# Patient Record
Sex: Male | Born: 1935 | Race: White | Hispanic: No | Marital: Married
Health system: Southern US, Community
[De-identification: ages and names within clinical notes are randomized; demographics above are authoritative.]

---

## 2011-03-31 LAB — COMPREHENSIVE METABOLIC PANEL
Albumin: 3.3 g/dL — ABNORMAL LOW (ref 3.4–5.0)
Alkaline Phosphatase: 77 U/L (ref 50–136)
BUN: 20 mg/dL — ABNORMAL HIGH (ref 7–18)
Calcium, Total: 8.9 mg/dL (ref 8.5–10.1)
Glucose: 143 mg/dL — ABNORMAL HIGH (ref 65–99)
SGOT(AST): 30 U/L (ref 15–37)
Sodium: 139 mmol/L (ref 136–145)

## 2011-03-31 LAB — CBC
HCT: 38.8 % — ABNORMAL LOW (ref 40.0–52.0)
HGB: 13.2 g/dL (ref 13.0–18.0)
MCH: 31.2 pg (ref 26.0–34.0)
MCHC: 34.1 g/dL (ref 32.0–36.0)
MCV: 91 fL (ref 80–100)
RBC: 4.25 10*6/uL — ABNORMAL LOW (ref 4.40–5.90)

## 2011-04-01 ENCOUNTER — Inpatient Hospital Stay: Payer: Self-pay | Admitting: Student

## 2011-04-01 LAB — URINALYSIS, COMPLETE
Bacteria: NONE SEEN
Bilirubin,UR: NEGATIVE
Glucose,UR: 150 mg/dL (ref 0–75)
Leukocyte Esterase: NEGATIVE
Nitrite: NEGATIVE
Ph: 6 (ref 4.5–8.0)
Protein: NEGATIVE
Specific Gravity: 1.023 (ref 1.003–1.030)

## 2011-04-01 LAB — TROPONIN I
Troponin-I: 0.02 ng/mL
Troponin-I: 0.03 ng/mL

## 2011-04-01 LAB — CK TOTAL AND CKMB (NOT AT ARMC)
CK, Total: 167 U/L (ref 35–232)
CK, Total: 249 U/L — ABNORMAL HIGH
CK-MB: 1.3 ng/mL (ref 0.5–3.6)
CK-MB: 1.9 ng/mL

## 2011-04-02 LAB — TROPONIN I: Troponin-I: <0.02 ng/mL

## 2011-04-02 LAB — CK TOTAL AND CKMB (NOT AT ARMC)
CK, Total: 285 U/L — ABNORMAL HIGH (ref 35–232)
CK-MB: 2.4 ng/mL (ref 0.5–3.6)

## 2011-04-03 LAB — CBC WITH DIFFERENTIAL/PLATELET
Basophil #: 0 10*3/uL (ref 0.0–0.1)
Basophil %: 0.3 %
HCT: 36.8 % — ABNORMAL LOW (ref 40.0–52.0)
HGB: 12.5 g/dL — ABNORMAL LOW (ref 13.0–18.0)
Lymphocyte #: 0.5 10*3/uL — ABNORMAL LOW (ref 1.0–3.6)
Lymphocyte %: 9 %
MCHC: 34 g/dL (ref 32.0–36.0)
MCV: 91 fL (ref 80–100)
Monocyte #: 0.3 10*3/uL (ref 0.0–0.7)
Monocyte %: 5.3 %
Neutrophil #: 4.6 10*3/uL (ref 1.4–6.5)
Neutrophil %: 85.2 %
RDW: 13.6 % (ref 11.5–14.5)
WBC: 5.4 10*3/uL (ref 3.8–10.6)

## 2011-04-03 LAB — BASIC METABOLIC PANEL
BUN: 16 mg/dL (ref 7–18)
Calcium, Total: 9 mg/dL (ref 8.5–10.1)
EGFR (African American): >60
EGFR (Non-African Amer.): >60
Glucose: 100 mg/dL — ABNORMAL HIGH (ref 65–99)
Osmolality: 279 (ref 275–301)
Sodium: 139 mmol/L (ref 136–145)

## 2011-04-05 ENCOUNTER — Encounter: Payer: Self-pay | Admitting: Internal Medicine

## 2011-04-06 LAB — CULTURE, BLOOD (SINGLE)

## 2011-04-14 LAB — TSH: Thyroid Stimulating Horm: 1.43 u[IU]/mL

## 2011-04-29 ENCOUNTER — Encounter: Payer: Self-pay | Admitting: Internal Medicine

## 2011-05-06 ENCOUNTER — Ambulatory Visit: Payer: Self-pay | Admitting: Internal Medicine

## 2013-01-17 ENCOUNTER — Ambulatory Visit: Payer: Self-pay | Admitting: Internal Medicine

## 2013-01-17 ENCOUNTER — Inpatient Hospital Stay: Payer: Self-pay | Admitting: Student

## 2013-01-17 LAB — COMPREHENSIVE METABOLIC PANEL
Albumin: 3.3 g/dL — ABNORMAL LOW (ref 3.4–5.0)
Anion Gap: 4 — ABNORMAL LOW (ref 7–16)
BUN: 15 mg/dL (ref 7–18)
Bilirubin,Total: 0.8 mg/dL (ref 0.2–1.0)
Co2: 29 mmol/L (ref 21–32)
Creatinine: 1.2 mg/dL (ref 0.60–1.30)
EGFR (African American): >60
EGFR (Non-African Amer.): 53 — ABNORMAL LOW
Glucose: 128 mg/dL — ABNORMAL HIGH (ref 65–99)

## 2013-01-17 LAB — CBC
HCT: 39.4 % — ABNORMAL LOW (ref 40.0–52.0)
MCH: 31.2 pg (ref 26.0–34.0)
MCHC: 35 g/dL (ref 32.0–36.0)
MCV: 89 fL (ref 80–100)
Platelet: 206 10*3/uL (ref 150–440)
RBC: 4.42 10*6/uL (ref 4.40–5.90)
RDW: 13.2 % (ref 11.5–14.5)
WBC: 9.6 10*3/uL (ref 3.8–10.6)

## 2013-01-17 LAB — HEMOGLOBIN A1C: Hemoglobin A1C: 5.7 % (ref 4.2–6.3)

## 2013-01-17 LAB — URINALYSIS, COMPLETE
Glucose,UR: 50 mg/dL (ref 0–75)
Ketone: NEGATIVE
Nitrite: NEGATIVE
Ph: 6 (ref 4.5–8.0)
Protein: NEGATIVE
RBC,UR: 20 /HPF (ref 0–5)
Specific Gravity: 1.021 (ref 1.003–1.030)
Squamous Epithelial: NONE SEEN
WBC UR: 2 /HPF (ref 0–5)

## 2013-01-17 LAB — CK TOTAL AND CKMB (NOT AT ARMC): CK, Total: 57 U/L (ref 35–232)

## 2013-01-17 LAB — TROPONIN I: Troponin-I: <0.02 ng/mL

## 2013-01-18 LAB — CBC WITH DIFFERENTIAL/PLATELET
Basophil #: 0 10*3/uL (ref 0.0–0.1)
Eosinophil %: 0 %
HCT: 36.1 % — ABNORMAL LOW (ref 40.0–52.0)
HGB: 12.8 g/dL — ABNORMAL LOW (ref 13.0–18.0)
Lymphocyte #: 0.4 10*3/uL — ABNORMAL LOW (ref 1.0–3.6)
MCH: 31.3 pg (ref 26.0–34.0)
MCV: 88 fL (ref 80–100)
Monocyte %: 1.8 %
Neutrophil #: 12.4 10*3/uL — ABNORMAL HIGH (ref 1.4–6.5)
Neutrophil %: 95 %
WBC: 13 10*3/uL — ABNORMAL HIGH (ref 3.8–10.6)

## 2013-01-18 LAB — COMPREHENSIVE METABOLIC PANEL
Albumin: 2.6 g/dL — ABNORMAL LOW (ref 3.4–5.0)
Alkaline Phosphatase: 81 U/L (ref 50–136)
Chloride: 109 mmol/L — ABNORMAL HIGH (ref 98–107)
Co2: 24 mmol/L (ref 21–32)
EGFR (Non-African Amer.): 58 — ABNORMAL LOW
Osmolality: 286 (ref 275–301)
SGPT (ALT): 19 U/L (ref 12–78)
Total Protein: 6.9 g/dL (ref 6.4–8.2)

## 2013-01-21 LAB — CBC WITH DIFFERENTIAL/PLATELET
Basophil #: 0 10*3/uL (ref 0.0–0.1)
Basophil %: 0.2 %
Eosinophil %: 0 %
HCT: 38.6 % — ABNORMAL LOW (ref 40.0–52.0)
HGB: 13.5 g/dL (ref 13.0–18.0)
Lymphocyte %: 4.5 %
MCH: 31.2 pg (ref 26.0–34.0)
MCHC: 34.9 g/dL (ref 32.0–36.0)
MCV: 89 fL (ref 80–100)
Monocyte #: 0.5 x10 3/mm (ref 0.2–1.0)
Monocyte %: 4.2 %
Neutrophil %: 91.1 %
RDW: 13.1 % (ref 11.5–14.5)

## 2013-01-26 ENCOUNTER — Ambulatory Visit: Payer: Self-pay | Admitting: Internal Medicine

## 2013-02-05 ENCOUNTER — Emergency Department: Payer: Self-pay | Admitting: Emergency Medicine

## 2013-02-05 LAB — COMPREHENSIVE METABOLIC PANEL
BUN: 31 mg/dL — ABNORMAL HIGH (ref 7–18)
Osmolality: 293 (ref 275–301)
Potassium: 4.3 mmol/L (ref 3.5–5.1)
SGOT(AST): 26 U/L (ref 15–37)
Sodium: 142 mmol/L (ref 136–145)
Total Protein: 6.6 g/dL (ref 6.4–8.2)

## 2013-02-05 LAB — URINALYSIS, COMPLETE
Bilirubin,UR: NEGATIVE
Glucose,UR: 50 mg/dL (ref 0–75)
Ketone: NEGATIVE
RBC,UR: 1072 /HPF (ref 0–5)

## 2013-02-05 LAB — CBC
HGB: 14.4 g/dL (ref 13.0–18.0)
MCV: 91 fL (ref 80–100)
WBC: 10.7 10*3/uL — ABNORMAL HIGH (ref 3.8–10.6)

## 2013-02-08 LAB — URINE CULTURE

## 2013-02-10 LAB — CULTURE, BLOOD (SINGLE)

## 2013-02-12 ENCOUNTER — Inpatient Hospital Stay: Payer: Self-pay | Admitting: Internal Medicine

## 2013-02-12 LAB — URINALYSIS, COMPLETE
Bacteria: NONE SEEN
Bilirubin,UR: NEGATIVE
Nitrite: NEGATIVE
Ph: 5 (ref 4.5–8.0)
RBC,UR: 737 /HPF (ref 0–5)
Specific Gravity: 1.029 (ref 1.003–1.030)

## 2013-02-12 LAB — CK TOTAL AND CKMB (NOT AT ARMC): CK, Total: 49 U/L (ref 35–232)

## 2013-02-12 LAB — COMPREHENSIVE METABOLIC PANEL
Alkaline Phosphatase: 139 U/L — ABNORMAL HIGH (ref 50–136)
Calcium, Total: 9.8 mg/dL (ref 8.5–10.1)
EGFR (African American): 55 — ABNORMAL LOW
Potassium: 4 mmol/L (ref 3.5–5.1)
SGOT(AST): 29 U/L (ref 15–37)
SGPT (ALT): 44 U/L (ref 12–78)
Total Protein: 7.4 g/dL (ref 6.4–8.2)

## 2013-02-12 LAB — CBC
HCT: 46.4 % (ref 40.0–52.0)
HGB: 15.3 g/dL (ref 13.0–18.0)
MCH: 30.3 pg (ref 26.0–34.0)
RBC: 5.05 10*6/uL (ref 4.40–5.90)
WBC: 5.2 10*3/uL (ref 3.8–10.6)

## 2013-02-12 LAB — TROPONIN I: Troponin-I: 0.02 ng/mL

## 2013-02-13 LAB — CBC WITH DIFFERENTIAL/PLATELET
Basophil %: 0.1 %
Eosinophil #: 0 10*3/uL (ref 0.0–0.7)
Eosinophil %: 0 %
HGB: 11.9 g/dL — ABNORMAL LOW (ref 13.0–18.0)
Lymphocyte %: 3.8 %
MCH: 30.3 pg (ref 26.0–34.0)
MCHC: 33.2 g/dL (ref 32.0–36.0)
MCV: 91 fL (ref 80–100)
Monocyte #: 0.3 x10 3/mm (ref 0.2–1.0)
Monocyte %: 4.9 %
Platelet: 162 10*3/uL (ref 150–440)
RBC: 3.93 10*6/uL — ABNORMAL LOW (ref 4.40–5.90)

## 2013-02-13 LAB — BASIC METABOLIC PANEL
Anion Gap: 8 (ref 7–16)
BUN: 23 mg/dL — ABNORMAL HIGH (ref 7–18)
Calcium, Total: 8.6 mg/dL (ref 8.5–10.1)
Co2: 24 mmol/L (ref 21–32)
Creatinine: 1.02 mg/dL (ref 0.60–1.30)
EGFR (African American): >60
Glucose: 304 mg/dL — ABNORMAL HIGH (ref 65–99)
Osmolality: 321 (ref 275–301)
Potassium: 3.8 mmol/L (ref 3.5–5.1)

## 2013-02-13 LAB — TROPONIN I: Troponin-I: 0.02 ng/mL

## 2013-02-16 LAB — URINE CULTURE

## 2013-02-17 LAB — CULTURE, BLOOD (SINGLE)

## 2013-02-25 ENCOUNTER — Ambulatory Visit: Payer: Self-pay | Admitting: Internal Medicine

## 2013-02-25 DEATH — deceased

## 2014-07-18 NOTE — H&P (Signed)
PATIENT NAME:  Julian SleeperMURDOCK, Kurtis M MR#:  [id number] DATE OF BIRTH:  11-08-1921  DATE OF ADMISSION:  01/17/2013  PRIMARY CARE PHYSICIAN: Dr. Cay SchillingsJack Wolf.  The patient is an 81 year old Caucasian male with past medical history significant for history of TIAs, history of dysphagia, as well as history of mental decline, dementia, who presents to the hospital with complaints of chest congestion. Apparently, the patient was doing well up until a few days ago when he started having problems with chest congestion. He denies any episodes of dysphagia or choking on food, but he admits of rattling in the chest as well as producing some clear thin liquid. Because family knew his history of aspiration, they suspected that he may have had pneumonia, so they decided to bring him to the Emergency Room for further evaluation. He was weaker and more lethargic than usual.  Patient lives in [place] with his wife independently. His niece, who lives close by, helps him with groceries, but otherwise they are able to cook at home and able to function quite well. Apparently, since the years he was noted to have some mental decline and  speech decline. The patient denies any chest pains. Admits to having some cough.   PAST MEDICAL HISTORY: Significant for history of admission in January 2013 for bronchitis, deconditioning, gait dysfunction, dementia as well as dysphagia. Also, history of coronary artery disease, status post MI. History of stent in [facility] in 1996. History of TIAs. BPH as based on medication list.   PAST SURGICAL HISTORY: Stents x 3 in 1996.   MEDICATIONS: The patient's medication list is as follows: Aspirin 81 mg daily, donepezil 5 mg p.o. at bedtime and Flomax 0.4 mg daily. Based on his medication list, I also suspect BPH.   ALLERGIES: None.   FAMILY HISTORY: The patient's mother as well father both died of coronary artery disease and myocardial infarction in their 280s.   SOCIAL HISTORY: The  patient is married. He has no children. Lives with his wife independently. Niece is helping them with groceries although they are able to function well in the house alone and they cook for themselves. No alcohol, illicit drugs. No smoking. He used to work as a Retail buyercomputer operator [place]  REVIEW OF SYSTEMS: Difficult to obtain since the patient had BiPAP on his nose. He denies any fevers, chills, fatigue, weakness. Denies any pains or weight loss or gain.  EYES: Denies any blurry vision, double vision, glaucoma or cataracts.  ENT: Denies any tinnitus, allergies, epistaxis, sinus pain, dentures, difficulty swallowing.  RESPIRATORY: Admits to cough. Denies any wheezes, hemoptysis. Admits to shortness of breath, as well as light colored, thin sputum.  CARDIOVASCULAR: Denies any chest pain, orthopnea, edema, arrhythmias, palpitations or syncope. GASTROINTESTINAL: Denies any nausea, vomiting, (Dictation Anomaly)diarrhea, abdominal pains  . GENITOURINARY: Denies urinary (Dictation Anomaly)frequency or  incontinence.  ENDOCRINE: (Dictation Anomaly)Denies   thyroid problems, heat or cold intolerance or thirst.  HEMATOLOGIC: Denies anemia, easy bruisability (Dictation Anomaly) or bleeding.  SKIN: Denies any acne, rashes, lesions or change in moles.  MUSCULOSKELETAL: Denies arthritis, cramps, swelling. No numbness, (Dictation Anomaly)weakness  or tremor.  PSYCHIATRIC: Denies anxiety, insomnia or depression.   PHYSICAL EXAMINATION: VITAL SIGNS: On arrival to the hospital, the patient's vital signs temperature is 98.9, pulse is 71, respirations 18, blood pressure 146/67, saturation was 94% on oxygen therapy.  GENERAL: This is a well-developed, well-nourished, Caucasian male in no significant distress lying on the stretcher.  HEENT: Pupils are equal, reactive  to light. Extraocular movements are intact, no icterus or conjunctivitis. Has normal hearing. Mucosa is moist.  NECK: No masses. Supple,  nontender. Thyroid is not enlarged. No adenopathy. No JVD or carotid bruits bilaterally. Full range of motion.  LUNGS: Rales as well as rhonchi were heard bilaterally in the upper lobe, somewhat diminished breath sounds were noted in the right upper lobe and no significant wheezing. The patient does have labored inspirations as well as increased effort to breathe. He is in mild respiratory distress. He is using BiPAP to breathe. His oxygen saturation are 96% on BiPAP.  CARDIOVASCULAR: S1, S2 appreciated (Dictation Anomaly), rythm was regular .  CHEST: Is nontender to palpation.  EXTREMITIES: 1+ pedal pulses. No lower extremity edema, calf tenderness or cyanosis was noted.  ABDOMEN: Soft, nontender. Bowel sounds are present. No hepatosplenomegaly or masses were noted.  RECTAL: Deferred.  MUSCLE STRENGTH: Able to move all extremities. No cyanosis, degenerative joint disease. Mild kyphosis. Gait is not tested.  SKIN: Did not show any rashes, lesions, erythema, nodularity or induration. It was warm and dry to palpation.  LYMPHATIC: No adenopathy in the cervical region.  NEUROLOGIC: Cranial nerves grossly intact. Sensory is intact. No dysarthria aphasia. The patient is alert and oriented to person, place. Cooperative.  Memory (Dictation Anomaly)is good  good.  However, I was not able to explore much. Otherwise, no depression or agitation noted.   EKG: Sinus rhythm with premature atrial complexes at 73 beats per minute, right bundle branch block, and no acute ST-T changes were noted. Compared to prior EKG done in January 2013, no changes were noted.   The patient's lab data done in the Emergency Room showed a glucose 128. Otherwise, BMP was unremarkable. Albumin level was low at 3.3. Liver enzymes were normal. Cardiac enzymes, first set negative. White blood cell count was normal at 9.6, hemoglobin was 13.8 and platelet count was 206. Chest x-ray showed right upper lobe infiltrate   ASSESSMENT AND  PLAN: 1. Acute respiratory failure due to a questionable  aspiration pneumonia.  Admit the patient to the medical floor and telemetry. The patient initially told me that he would not want  to have intubation; however, the patient's family, his wife questioned this assumption, so we will be getting palliative care involved to explain what it is with intubation and in general discuss code. 2. Right upper lobe pneumonia. Suspected aspiration. We will get speech therapist involved. We will continue the patient on Zosyn. We will get sputum cultures. We will also cover atypicals with Zithromax.  3. We will add also steroids, inhalers and will continue oxygen therapy via BiPAP for now. Wean off BiPAP if possible depending on his improvement.   4. Dysphagia. We will ask speech to evaluate him.  5. Dementia. We will continue home medications.   TIME SPENT: 50 minutes on the patient.   ____________________________ Katharina Caper, MD rv:sg D: 01/17/2013 13:11:40 ET T: 01/17/2013 13:48:57 ET JOB#: [id number]  cc: Katharina Caper, MD, <Dictator> Mickie Hillier. Sheppard Penton, MD   Katharina Caper MD ELECTRONICALLY SIGNED 02/23/2013 20:41

## 2014-07-18 NOTE — Discharge Summary (Signed)
PATIENT NAME:  Julian Lewis, CAPERTON MR#:  [id number] DATE OF BIRTH:  1921-04-02  DATE OF ADMISSION:  02/12/2013 DATE OF DISCHARGE:  02/13/2013  ADMITTING PHYSICIAN:  Alford Highland, MD  DISCHARGING PHYSICIAN: Enid Baas, MD  PRIMARY CARE PHYSICIAN: Mickie Hillier. Sheppard Penton, MD  CONSULTATIONS IN THE HOSPITAL: Palliative care consultation by Ned Grace, MD  DISCHARGE DIAGNOSES: 1.  Acute respiratory failure.  2.  Aspiration pneumonia.  3.  History of cerebrovascular accident with dysphagia.  4.  Dementia.  5.  Esophageal dysmotility disorder seen on modified barium swallow study from October 2014.  5.  Coronary artery disease, status post stents.  6.  Sepsis.  7.  Atrial fibrillation with rapid ventricular response.  8.  Acute metabolic encephalopathy.  9.  Acute renal failure.  10.  Hypernatremia.   DISCHARGE MEDICATIONS: 1.  Tylenol 650 mg p.o. q.4 hours p.r.n. for pain or fever.  2.  Scopolamine patch every 3 days, 1 patch.  3.  Roxanol 20 mg per her mL oral concentrate 0.25 mL to 0.5 mL q.1 to 2 hours p.r.n. for pain or dyspnea.  4.  Ativan 0.5 mg 1 to 2 tablets orally q.2 to 4 hours p.r.n. for agitation or anxiety.   LABS AND IMAGING STUDIES PRIOR TO DISCHARGE:  1.  WBC 7.0, hemoglobin 11.9, hematocrit 36.0, platelet count 162.  2.  Sodium 154, potassium 3.8, chloride 122, bicarb 24, BUN 23, creatinine 1.02, glucose 304 and calcium of 8.6.  3.  Troponins negative.  4.  Blood cultures negative so far while in the hospital.  5.  Chest x-ray showing bilateral interstitial thickening concerning for bronchitis versus edema. 6.  Urinalysis with 3+ blood and several RBCs. No bacteria seen.   BRIEF HOSPITAL COURSE: Mr. Ferrante is an 81 year old elderly, Caucasian gentleman with past medical history significant for dementia, prior history of CVA, coronary artery disease, history of dysphagia with recent admission for aspiration pneumonia, who was on a dysphagia diet at the rehab facility,  [facility], was brought in secondary to worsening  respiratory failure.  1.  Acute hypoxic respiratory failure. Was started on BiPAP. Chest x-ray with possible bronchitis and likely recurrent aspiration pneumonia. The patient was gradually weaned over to a 50% Ventimask and still tachypneic. He is started on broad-spectrum antibiotics with vanc, Zosyn and Levaquin on admission. However, considering his poor prognosis, palliative care was consulted and family was interested in hospice and the patient is being transferred to hospice home.  2.  Sepsis secondary to pneumonia. The patient has not required any pressors during the hospital course. He was requiring IV fluids and on stress-dose steroids and also was on antibiotics.  3.  A. fib with RVR on presentation, likely triggered by sepsis. He was started on Cardizem drip and that was discontinued once the patient converted to normal sinus rhythm. He was receiving  IV metoprolol.  4.  Acute metabolic encephalopathy secondary to sepsis from pneumonia was improving. Has baseline dementia. The patient was alert and oriented to self to discharge.  5.  Acute renal failure secondary to ATN from sepsis.  6.  Hypernatremia, free water deficit. Was placed on D5/half-normal saline.  7.  Dysphagia with prior history of CVA and recurrent aspiration pneumonia. The patient was n.p.o. while here, awaiting a speech eval, however, the patient is being transferred to hospice home at this time. Appreciate palliative care consult. Family has discussed multiple options and finally agreed for hospice home.    DISCHARGE CONDITION: Guarded with poor prognosis.  DISCHARGE DISPOSITION:  Hospice home.   TIME SPENT ON DISCHARGE: 40 minutes.   ____________________________ Enid Baasadhika Yancey Pedley, MD rk:cs D: 02/13/2013 14:28:32 ET T: 02/13/2013 15:34:04 ET JOB#: [id number]  cc: Enid Baasadhika Haya Hemler, MD, <Dictator> Mickie HillierJack H. Sheppard PentonWolf, MD Enid BaasADHIKA Teion Ballin MD ELECTRONICALLY SIGNED  02/19/2013 18:17

## 2014-07-18 NOTE — H&P (Signed)
PATIENT NAME:  Julian Lewis, Julian Lewis MR#:  [id number] DATE OF BIRTH:  1921-11-03  DATE OF ADMISSION:  02/12/2013  PRIMARY CARE PHYSICIAN: Dr. Cay Schillings, patient now currently at [facility].   CHIEF COMPLAINT: Sent in with pneumonia.   HISTORY OF PRESENT ILLNESS: This is an 81 year old man who was recently in the hospital. He has been battling pneumonia for the past 4 weeks. He does have a history of dysphagia and is on puree with thickened liquids. He has been confused over the last few days. He has been coughing, short of breath, patient currently on the BiPAP in order to oxygenate. The patient is confused and unable to give much history, history obtained from family who is at the bedside.   Vital signs on presentation included a temperature of 102.3, pulse 115, respirations 30, blood pressure 50/40, pulse oximetry 95% on 100%. In the ER, he was in rapid atrial fibrillation,  found to have pneumonia on chest x-ray. Hospitalist services were contacted for further evaluation.   PAST MEDICAL HISTORY: History of CVA, dementia, dysphagia, gait dysfunction, coronary artery disease, TIA, and BPH.   PAST SURGICAL HISTORY: Stents in 1996.   ALLERGIES: No known drug allergies.   MEDICATIONS: Include DuoNeb 3 mL 4 times a day, amlodipine 2.5 mg daily, aspirin 81 mg daily, Rocephin 1 gram IM twice a day, donepezil (which is Aricept) 5 mg daily at bedtime, Dulcolax p.r.n., Flomax 0.4 mg daily, multivitamin daily, Protonix 40 mg daily, vitamin C 500 mg twice a day.   SOCIAL HISTORY: Currently at [facility]. Used to work on Arts administrator. No alcohol, smoking or drugs.   FAMILY HISTORY: Father killed in an MVA. Mother died at 109 of an MI.   REVIEW OF SYSTEMS: Unable to obtain secondary to altered mental status.   PHYSICAL EXAMINATION: VITAL SIGNS: Included a temperature of 102.3, pulse 115, respirations 30, blood pressure 50/40 on presentation, pulse oximetry 95% on 100%. Latest vital signs  included pulse 144, respirations 20, blood pressure 119/75.  GENERAL: Positive respiratory distress, using accessory muscles to breathe.  EYES: Conjunctivae and lids normal. Pupils unequal, left greater than right, both reactive to light. Unable to test extraocular muscles.  EARS, NOSE, MOUTH, AND THROAT: Tympanic membranes: No erythema. Nasal mucosa: No erythema. Throat: Dry.  NECK: No JVD. No bruits. No lymphadenopathy. No thyromegaly. No thyroid nodules palpated.  RESPIRATORY: Positive use of accessory muscles to breathe. Decreased breath sounds bilaterally. Rhonchi, right base.  CARDIOVASCULAR: S1, S2 irregularly irregular, II/VI systolic ejection murmur. Carotid upstroke 2+ bilaterally, no bruits. Dorsalis pedis pulses 1+ bilaterally. Trace edema of the lower extremity.  ABDOMEN: Soft, nontender. No organomegaly/splenomegaly. Normoactive bowel sounds. No masses felt.  LYMPHATIC: No lymph nodes in the neck.  MUSCULOSKELETAL: Trace edema. No cyanosis on oxygen. No clubbing. NEUROLOGICAL: Cranial nerves difficult to test secondary to altered mental status. Deep tendon reflexes 1/2+ bilateral lower extremity.  PSYCHIATRIC: The patient is alert. Mental status difficult to test secondary to patient's speech being difficult to understand.  SKIN: No ulcers anteriorly.   LABORATORY AND RADIOLOGICAL DATA: Lactic acid 4.0. Venous pH 7.35. BNP 440. White blood cell count 5.2, hemoglobin and hematocrit 15.3 and 46.4, platelet count of 218. Glucose 181, BUN 27, creatinine 1.33, sodium 152, potassium 4.0, chloride 116, CO2 of 26, calcium 9.8. Liver function tests: Alkaline phosphatase elevated at 139, ALT 44, AST 29. Troponin negative. Chest x-ray: Right lower lobe pneumonia. Urinalysis: Trace leukocyte esterase.   ASSESSMENT AND PLAN: 1.  Acute  respiratory failure requiring BiPAP in order to oxygenate. Since the patient is a DO NOT RESUSCITATE, we did not intubate this patient on presentation. Overall  prognosis is poor. We will get a palliative care consultation.  2.  Severe sepsis with tachycardia, fever hypotension, acute renal failure with aspiration pneumonia. We will give IV fluid bolus and hydration. Empiric Zosyn, Levaquin, and vancomycin. Since the patient is in a nursing home facility, need to get aggressive with the antibiotics. Will also give Solu-Cortef 100 mg IV stat and q.8 hours and DuoNeb nebulizer solution.  3.  Rapid atrial fibrillation. We will give Cardizem drip, low dose, to try to maintain heart rate. Hopefully blood pressure will increase as the heart rate slows down.  4.  Acute encephalopathy with history of dementia, likely worsened with severe sepsis.  5.  Acute renal failure. We will give IV fluid hydration and check a BMP in the a.m.  6.  History of cerebrovascular accident.  7.  History of coronary artery disease.  8.  History of benign prostatic hypertrophy.  9.  Dysphagia: We will have to keep n.p.o. and hold all oral medications at this time until off the BiPAP.  10.  The patient is a DO NOT RESUSCITATE. Overall prognosis is poor. High risk of cardiopulmonary arrest. Family at the bedside and understands that he is critically ill.   TIME SPENT ON ADMISSION: 55 minutes of critical care.   ____________________________ Herschell Dimesichard J. Renae GlossWieting, MD rjw:jcm D: 02/12/2013 18:31:30 ET T: 02/12/2013 18:44:07 ET JOB#: [id number]  cc: Herschell Dimesichard J. Renae GlossWieting, MD, <Dictator> Salley ScarletICHARD J Naja Apperson MD ELECTRONICALLY SIGNED 02/19/2013 14:14

## 2014-07-18 NOTE — Discharge Summary (Signed)
PATIENT NAME:  Julian Lewis, Julian Lewis MR#:  [id number] DATE OF BIRTH:  [birth year]  DATE OF DISCHARGE: 01/23/2013   CONSULTANTS: PT, speech therapy, and palliative care.   PRIMARY CARE PHYSICIAN: Rowe RobertJack Walsh, MD  CHIEF COMPLAINT: Shortness of breath and congestion.   DISCHARGE DIAGNOSES:  1.  Acute respiratory failure secondary to aspiration pneumonia.  2.  Severe sepsis.  3.  Urinary retention.  4.  Gastroesophageal reflux disease. 5.  Dementia.  6.  History of dysphasia.  7.  Hematuria, resolved.  8.  Acute urinary retention, now on Foley.  9.  History of coronary artery disease status post myocardial infarction.  10.  History of stent at [facility].  11.  History of transient ischemic attacks.  12.  Benign prostatic hypertrophy per chart.   DISCHARGE MEDICATIONS: Vantin 100 mg two times a day for 3 more days; aspirin 81 mg daily; Flomax 0.4 mg daily; donepezil 5 mg at bedtime; amlodipine 2.5 mg daily; pantoprazole 40 mg at bedtime; prednisone 30 mg for one day, then 20 mg for one day, then 10 mg for one day, then stop; albuterol/ipratropium three mL 4 times a day as needed for shortness of breath. He will be going with Foley and 2 liters nasal cannula.   DIET: Low-sodium, pureed, nectar-thick liquids. Strict aspiration precautions. Meds pureed, crushed. Feeding assistance at all times. Nectar liquid by Tsp or single small sips monitored by staff and a Mighty Shake, nectar, or Magic Cup supplements as ordered by MD.  Ladona MowMoisten and flavor food well with gravy and butters. Alternate foods with liquids.   ACTIVITY: As tolerated.   REFERRAL: To hospice.   FOLLOWUP: Please follow with PCP within 1 to 2 weeks. Also suctioning in morning and as needed every 4 hours.   CODE STATUS: DNR.   HISTORY OF PRESENT ILLNESS AND HOSPITAL COURSE: For full details of H and P, please see the dictation on October 23 by Dr. Winona LegatoVaickute, but briefly this is an 81 year old male with history of TIAs, dysphagia,  dementia, who came in with chest congestion, episodes of dysphagia and choking on food with some cough and came into the hospital with the above chief complaint.   The patient was admitted to the hospitalist service as he did have low oxygenations and started on Zosyn, and Zithromax steroids were added as well as some BiPAP. Speech was also consulted. The patient did have severe sepsis per criteria with respiratory failure and pneumonia. He was maintained on IV antibiotics and has finished 7 days of antibiotics, and at this point, the sepsis has resolved. He is still oxygen dependent but the repeat x-rays of the chest have shown the pneumonia to be resolved.   There was some question initially whether this was aspiration pneumonia or community-acquired pneumonia, however, ultimately modified barium swallow study was done showing signs and symptoms of aspiration, and his diet was changed.   I believe that the patient has been having signs and symptoms of aspiration for a while and this is aspiration pneumonia, and at this point, he will be discharged with additional 3 days; however, I have discussed multiple times with the family that, given the history of dysphagia and this being aspiration pneumonia, he is at high risk of developing further aspiration events leading to pneumonitis and pneumonia.   His diet has been changed. There is also some GERD per family and he has been discharged on acid suppressor. He does have significant dementia which complicates the picture. He did have mild  hematuria which has resolved. He did have acute urinary retention. He is on Flomax and a Foley has been inserted.   The patient was followed by palliative care as well. At this point, blood cultures have been negative. He does benefit from suctioning as dictated above. He will be discharged on p.o. steroids and some nebs as needed.   PROGNOSIS: Overall, poor, and the family is aware. He will be discharged with hospice  referral.  TOTAL TIME SPENT: 35 minutes.   ____________________________ Krystal Eaton, MD sa:np D: 01/23/2013 14:35:29 ET T: 01/23/2013 14:46:31 ET JOB#: [id number]  cc: Krystal Eaton, MD, <Dictator> Mickie Hillier. Sheppard Penton, MD Krystal Eaton MD ELECTRONICALLY SIGNED 02/14/2013 2:58

## 2014-07-18 NOTE — Consult Note (Signed)
   Comments   I had several meetings with pt's wife, wife's sister and niece, Julian Lewis,  who is pt's wife's POA. I reviewed in detail the options of continued aggressive medical care vs transitioning pt to comfort care. Pt told sister-in-law yesterday that he did not want to continue living with such a poor quality of life. Wife admits that pt was not doing well at rehab and she does not want him to return to SNF. We discussed home with hospice vs Hospice Home. Wife very much wants to take pt home but is realistic and knows that she could not provide adequate care for him, even with private aides. Both niece and sister-in-law feel that pt would be best served [facility]. Ginny Ward, RN, agreed to meet with family to discuss further.   Electronic Signatures: Syler Norcia, Harriett SineNancy (MD)  (Signed [id number]-Nov-14 14:02)  Authored: Palliative Care   Last Updated: 19-Nov-14 14:02 by Stanely Sexson, Harriett SineNancy (MD)

## 2014-07-20 NOTE — H&P (Signed)
PATIENT NAME:  Julian Lewis, Julian Lewis MR#:  [id number] DATE OF BIRTH:  12-Nov-1921  DATE OF ADMISSION:  03/31/2011  REFERRING PHYSICIAN: Dr. Bayard Males  PRIMARY CARE PHYSICIAN: Dr. Cay Schillings  REASON FOR ADMISSION: Ambulatory dysfunction, pneumonia, fever.   HISTORY OF PRESENT ILLNESS: This is an 81 year old white male with past medical history of coronary artery disease and dementia status post myocardial infarction with stents in 1996 who presents with progressive weakness over the last week, fever, chills, found to have temperature of 101.8 in the Emergency Room. He denies any chest pain, nausea, vomiting, diarrhea, fevers, chills, or shakes. He apparently had been confused but now is back to baseline. He had some urinary retention.  He was given Levaquin, chest x-ray, and labs in the ER.  His wife states that he has been progressively declining since last summer but worsened over the last week with fevers. She was told by the ER physician that he had pneumonia. We are asked to admit the patient for possible pneumonia. She said he was confused at times but is back to his baseline now.   PRIMARY CARE PHYSICIAN:  Dr. Cay Schillings  PAST MEDICAL HISTORY: Myocardial infarction, coronary artery disease, stents in 1996 at [facility].   HOME MEDICATIONS: Aspirin 81 mg daily.   PAST SURGICAL HISTORY: Stents times three in 1996.    ALLERGIES: None.   FAMILY HISTORY: Mother and father both died of coronary artery disease and myocardial infarction in their 21s.   SOCIAL HISTORY: He is married with no children. He does not drink or use illicit drugs, does not smoke. 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     REVIEW OF SYSTEMS:  CONSTITUTIONAL: No fever but he has fever, fatigue, weakness.  EYES: No blurred vision, double vision, pain, redness, inflammation, or glaucoma. ENT: No tinnitus, ear pain, hearing loss, seasonal allergies, epistaxis, or discharge. RESPIRATORY: No  cough, wheezing, hemoptysis, dyspnea, asthma, or painful respirations. CARDIOVASCULAR: No chest pain, orthopnea, edema, arrhythmia, dyspnea on exertion, or palpitations. GI: No nausea, vomiting, diarrhea, abdominal pain, hematemesis, melena, or gastroesophageal reflux disease. GU: He has dysuria but no hematuria, renal calculi, increased frequency, or incontinence. GU/male: No sores, discharge, or prostatitis. ENDOCRINE: No polyuria, nocturia, thyroid problems, increased sweating, heat or cold intolerance. HEME/LYMPH:  No anemia, easy bruising, or swollen glands. INTEGUMENT: No acne, rash, change in mole, hair, or skin. MUSCULOSKELETAL:  No pain the back, shoulder, knee, or hip.  No arthritis, swelling, or gout.  NEUROLOGIC: No numbness. He has weakness but no dysarthria, epilepsy, tremor, vertigo, or ataxia. PSYCH: No anxiety, insomnia, ADD, bipolar, or depression.   PHYSICAL EXAMINATION:  VITAL SIGNS: Temperature initially was 99.6, now 101.8, respiratory rate 20, heart rate 87, blood pressure 125/64, now 111/60, sating 94% on room air.   GENERAL: The patient is well developed, well nourished, looks weak.   HEENT: Pupils equal, reactive to light and accommodation. Extraocular movements intact.  Anicteric sclerae. No difficulty hearing. No conjunctivitis. He has dry mucous membranes. Oropharynx clear.   NECK: No JVD. No thyromegaly, no lymphadenopathy. No carotid bruits.   LUNGS: Clear to auscultation. No adventitious breath sounds. No use of accessory muscles or increased effort.   CARDIOVASCULAR: Regular rate and rhythm. Normal S1, S2. No murmurs, gallops, or rubs appreciated.  No lower extremity edema. 2+ dorsalis pedis pulses.   BREASTS: No obvious masses.   ABDOMEN: Soft, nontender, nondistended. Positive bowel sounds.   GU: Deferred.   MUSCULOSKELETAL: Strength  globally weak, 3-4/5. No cyanosis or degenerative joint disease.   SKIN: No rashes, lesions, or induration. He has tenting of  his skin.   LYMPH: No lymphadenopathy in the cervical area.   NEUROLOGIC: Cranial nerves II-XII are intact. +2 reflexes. He seems to have a little bit of a facial droop on the left side and some difficulty with his speech but his wife cannot appreciate this.   PSYCH: Alert and oriented times three.  LABORATORY DATA: Glucose 143, BUN 20, creatinine 1.05, sodium 139, potassium 4, chloride 104, bicarbonate 25, anion gap 10, total protein 7, albumin 3.3, total bilirubin 0.6, alkaline phosphatase 77, AST 30, ALT 26, white count 9.8, hemoglobin 13.2, hematocrit 38.8, platelets 166,  MCV 91. Influenza negative. Urinalysis shows no bacteria,  WBC 1. Head CT shows no acute intracranial masses. Chest x-ray shows no acute cardiopulmonary process.   ASSESSMENT AND PLAN: This is a pleasant 81 year old white male with past medical history of coronary artery disease and stents who presents with one week of weakness, inability to ambulate, fevers.  1. Inability to ambulate, altered mental status: We will treat as if the patient has transient ischemic attack or stroke. He does have some on my exam facial droop and difficulty with speech. We will get speech and language pathology, physical therapy evaluation, cycle his cardiac enzymes, increase his aspirin to 325 mg. Continue statin and allow for elevated blood pressure for central nervous system perfusion.  We will get echo and carotids. We will get MRI if he has any appreciable symptoms or focal deficits. 2. Fever most likely from bronchitis or viral upper respiratory infection: We will send off blood cultures. Urinalysis shows no urinary tract infection. We will continue Levaquin and nebulizers p.r.n. and Mucinex.  3. Dehydration with elevated BUN to creatinine ratio: We will give IV fluids.  4. Hyperglycemia: We will check A1c and lipid panel.  5. Coronary artery disease: We will continue aspirin and statin. Cannot give beta blocker due to  hypotension.  6. Low  albumin: Will screen for malnutrition.  7. Deep vein thrombosis prophylaxis: Maintain with Lovenox and aspirin.  8. CODE STATUS: FULL CODE.  9. I imagine the patient may need some rehab services or at least Home Health.  We will case management for this.   Thank you for allowing me allowing me to participate in the care of this patient.  TOTAL TIME SPENT ON ADMISSION: 60 minutes.    ____________________________ Corie ChiquitoAmir A. Lafayette DragonFirozvi, MD aaf:bjt D: 04/01/2011 08:55:26 ET T: 04/01/2011 09:41:30 ET JOB#: [id number]  cc: Karolee OhsAmir A. Lafayette DragonFirozvi, MD, <Dictator> Mickie HillierJack H. Sheppard PentonWolf, MD

## 2014-07-20 NOTE — H&P (Signed)
PATIENT NAME:  Julian Lewis, Julian Lewis MR#:  [id number] DATE OF BIRTH:  1921/09/18  DATE OF ADMISSION:  04/01/2011  REFERRING PHYSICIAN: Dr. Bayard Males  PRIMARY CARE PHYSICIAN: Dr. Cay Schillings  REASON FOR ADMISSION: Ambulatory dysfunction, pneumonia, fever.   HISTORY OF PRESENT ILLNESS: This is an 81 year old white male with past medical history of coronary artery disease and dementia status post myocardial infarction with stents in 1996 who presents with progressive weakness over the last week, fever, chills, found to have temperature of 101.8 in the Emergency Room. He denies any chest pain, nausea, vomiting, diarrhea, fevers, chills, or shakes. He apparently had been confused but now is back to baseline. He had some urinary retention.  He was given Levaquin, chest x-ray, and labs in the ER.  His wife states that he has been progressively declining since last summer but worsened over the last week with fevers. She was told by the ER physician that he had pneumonia. We are asked to admit the patient for possible pneumonia. She said he was confused at times but is back to his baseline now.   PRIMARY CARE PHYSICIAN:  Dr. Cay Schillings  PAST MEDICAL HISTORY: Myocardial infarction, coronary artery disease, stents in 1996 at [facility].   HOME MEDICATIONS: Aspirin 81 mg daily.   PAST SURGICAL HISTORY: Stents times three in 1996.    ALLERGIES: None.   FAMILY HISTORY: Mother and father both died of coronary artery disease and myocardial infarction in their 29s.   SOCIAL HISTORY: He is married with no children. He does not drink or use illicit drugs, does not smoke. 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     REVIEW OF SYSTEMS:  CONSTITUTIONAL: No fever but he has fever, fatigue, weakness.  EYES: No blurred vision, double vision, pain, redness, inflammation, or glaucoma. ENT: No tinnitus, ear pain, hearing loss, seasonal allergies, epistaxis, or discharge. RESPIRATORY: No  cough, wheezing, hemoptysis, dyspnea, asthma, or painful respirations. CARDIOVASCULAR: No chest pain, orthopnea, edema, arrhythmia, dyspnea on exertion, or palpitations. GI: No nausea, vomiting, diarrhea, abdominal pain, hematemesis, melena, or gastroesophageal reflux disease. GU: He has dysuria but no hematuria, renal calculi, increased frequency, or incontinence. GU/male: No sores, discharge, or prostatitis. ENDOCRINE: No polyuria, nocturia, thyroid problems, increased sweating, heat or cold intolerance. HEME/LYMPH:  No anemia, easy bruising, or swollen glands. INTEGUMENT: No acne, rash, change in mole, hair, or skin. MUSCULOSKELETAL:  No pain the back, shoulder, knee, or hip.  No arthritis, swelling, or gout.  NEUROLOGIC: No numbness. He has weakness but no dysarthria, epilepsy, tremor, vertigo, or ataxia. PSYCH: No anxiety, insomnia, ADD, bipolar, or depression.   PHYSICAL EXAMINATION:  VITAL SIGNS: Temperature initially was 99.6, now 101.8, respiratory rate 20, heart rate 87, blood pressure 125/64, now 111/60, sating 94% on room air.   GENERAL: The patient is well developed, well nourished, looks weak.   HEENT: Pupils equal, reactive to light and accommodation. Extraocular movements intact.  Anicteric sclerae. No difficulty hearing. No conjunctivitis. He has dry mucous membranes. Oropharynx clear.   NECK: No JVD. No thyromegaly, no lymphadenopathy. No carotid bruits.   LUNGS: Clear to auscultation. No adventitious breath sounds. No use of accessory muscles or increased effort.   CARDIOVASCULAR: Regular rate and rhythm. Normal S1, S2. No murmurs, gallops, or rubs appreciated.  No lower extremity edema. 2+ dorsalis pedis pulses.   BREASTS: No obvious masses.   ABDOMEN: Soft, nontender, nondistended. Positive bowel sounds.   GU: Deferred.   MUSCULOSKELETAL: Strength  globally weak, 3-4/5. No cyanosis or degenerative joint disease.   SKIN: No rashes, lesions, or induration. He has tenting of  his skin.   LYMPH: No lymphadenopathy in the cervical area.   NEUROLOGIC: Cranial nerves II-XII are intact. +2 reflexes. He seems to have a little bit of a facial droop on the left side and some difficulty with his speech but his wife cannot appreciate this.   PSYCH: Alert and oriented times three.  LABORATORY DATA: Glucose 143, BUN 20, creatinine 1.05, sodium 139, potassium 4, chloride 104, bicarbonate 25, anion gap 10, total protein 7, albumin 3.3, total bilirubin 0.6, alkaline phosphatase 77, AST 30, ALT 26, white count 9.8, hemoglobin 13.2, hematocrit 38.8, platelets 166,  MCV 91. Influenza negative. Urinalysis shows no bacteria,  WBC 1. Head CT shows no acute intracranial masses. Chest x-ray shows no acute cardiopulmonary process.   ASSESSMENT AND PLAN: This is a pleasant 81 year old white male with past medical history of coronary artery disease and stents who presents with one week of weakness, inability to ambulate, fevers.  1. Inability to ambulate, altered mental status: We will treat as if the patient has transient ischemic attack or stroke. He does have some on my exam facial droop and difficulty with speech. We will get speech and language pathology, physical therapy evaluation, cycle his cardiac enzymes, increase his aspirin to 325 mg. Continue statin and allow for elevated blood pressure for central nervous system perfusion.  We will get echo and carotids. We will get MRI if he has any appreciable symptoms or focal deficits. 2. Fever most likely from bronchitis or viral upper respiratory infection: We will send off blood cultures. Urinalysis shows no urinary tract infection. We will continue Levaquin and nebulizers p.r.n. and Mucinex.  3. Dehydration with elevated BUN to creatinine ratio: We will give IV fluids.  4. Hyperglycemia: We will check A1c and lipid panel.  5. Coronary artery disease: We will continue aspirin and statin. Cannot give beta blocker due to  hypotension.  6. Low  albumin: Will screen for malnutrition.  7. Deep vein thrombosis prophylaxis: Maintain with Lovenox and aspirin.  8. CODE STATUS: FULL CODE.  9. I imagine the patient may need some rehab services or at least Home Health.  We will case management for this.   Thank you for allowing me allowing me to participate in the care of this patient.  TOTAL TIME SPENT ON ADMISSION: 60 minutes.    ____________________________ Corie ChiquitoAmir A. Lafayette DragonFirozvi, MD aaf:bjt D: 04/01/2011 08:55:00 ET T: 04/01/2011 09:41:30 ET JOB#: [id number]  cc: Mickie HillierJack H. Sheppard PentonWolf, MD Corie ChiquitoAmir A. Lafayette DragonFirozvi, MD, <Dictator>  Coralyn PearAMIR A Chandlar Guice MD ELECTRONICALLY SIGNED 04/05/2011 14:42

## 2014-07-20 NOTE — Discharge Summary (Signed)
PATIENT NAME:  Julian Lewis, Julian Lewis MR#:  [id number] DATE OF BIRTH:  [birth year]  DATE OF ADMISSION:  04/01/2011 DATE OF DISCHARGE:  04/05/2011  CHIEF COMPLAINT: Fever and ambulatory dysfunction.   DISCHARGE DIAGNOSES:  1. Bronchitis. 2. Deconditioning. 3. Gait dysfunction. 4. Likely dementia. 5. Dysphasia.   DISCHARGE MEDICATIONS:  1. Flomax 0.4 mg daily.  2. Azithromycin 500 mg daily for two days.  3. Trazodone 50 mg p.o. at bedtime.  4. Aspirin 81 mg daily.   DIET: Dysphagia diet with thickened liquids with full aspiration precautions.   ACTIVITY: As tolerated.   REFERRALS:  Physical therapy.  DISCHARGE FOLLOWUP: Please followup with your PCP within 1 to 2 weeks. Please follow-up with Dr. Sherryll Burger from neurology on 04/12/2011 at 8:45 a.m..  HISTORY OF PRESENT ILLNESS: Please see the full history and physical dictated on 04/01/2011, but briefly this is an 81 year old male with history of CAD, dementia, and status post myocardial infarction in 1996 and stenting who presents with fever and ambulatory dysfunction. He was given Levaquin. He has been progressively declining since last summer with acute worsening over the last week with fevers. He was admitted to the hospitalist service for further evaluation and management.   SIGNIFICANT LABS/IMAGING: Creatinine on arrival 1.05. Hemoglobin A1c 6.6. TSH 1.29. Troponins negative x3. Initial WBC 9.8. Initial hemoglobin 13.2.   Blood cultures: No growth to date x2, from 04/01/2011.   Rapid flu negative.   Urinalysis on 04/01/2011 showed no nitrites or leukocyte esterase.   CT of the brain without contrast showed no acute intracranial process, generalized cerebral atrophy.   Chest x-ray showed bilateral apical pleural plaques. No focal parenchymal opacity. No acute disease of the chest.  Ultrasound of the carotids bilaterally showed no hemodynamically significant stenosis.   HOSPITAL COURSE: The patient was admitted to the hospitalist  service and initially started on antibiotics consisting of Levaquin. The patient did not have any significant leukocytosis, however. The patient did have a cough. However on further investigation, the patient turned out to have moderate to severe dysphagia. He underwent a barium swallow. The patient likely is aspirating. He was placed on dysphagia diet and multiple conversations took place between the speech therapist and myself about diet and feeding for this patient. In regards to the fever, the patient did have another fever of 100.3. Blood cultures and urinalysis as well as chest x-ray were all negative for acute infection. The patient likely has bronchitis. The patient did have acute confusion with Levaquin and that was stopped. The patient has had improved mentation since arrival and he has tolerated azithromycin. He is to continue azithromycin for an additional two days for bronchitis. In regards to his weakness and gait dysfunction, the patient likely has progressive weakness and decline in for months. Now he has some shuffling of gait as well as a flat affect. He has forgetfulness that has been persistent for several months up to a year. There is no diagnosis of dementia. Per family, however, the patient likely has dementia, possibly Parkinson's. At this point, he had CT of the head which was negative for acute CVA. I discussed the case with Dr. Sherryll Burger from neurology and he is willing to see the patient on 04/12/2011 for further evaluation. The patient did have clinical dehydration and has had poor p.o. intake. He was getting gentle IV fluids. He was seen by physical therapy and the recommendation was rehab. At this point, we would discharge the patient to [facility] with follow-up with his PCP as well  as Dr. Sherryll BurgerShah as described above.    CODE STATUS: The patient is DO NOT RESUSCITATE.   TOTAL TIME SPENT: 35 minutes  ____________________________ Krystal EatonShayiq Tanzie Rothschild, MD sa:slb D: 04/05/2011 14:56:55  ET T: 04/05/2011 15:19:41 ET JOB#: [id number]  cc: Krystal EatonShayiq Felis Quillin, MD, <Dictator> Krystal EatonSHAYIQ [place]lenne Ridge MD ELECTRONICALLY SIGNED 04/08/2011 20:37

## 2014-11-08 IMAGING — CT CT HEAD WITHOUT CONTRAST
1 series · 15 of 30 positions shown, 19 images · non-contrast
Comparison: none

REASON FOR EXAM: aspiration pneumonia, lethargic, rule out stroke
COMMENTS:

[Series 2: soft tissue · axial · 0.40mm/px · z∈[-38,+97]mm · 15 of 31 slices shown, 19 images]
[im 2/31  brain]
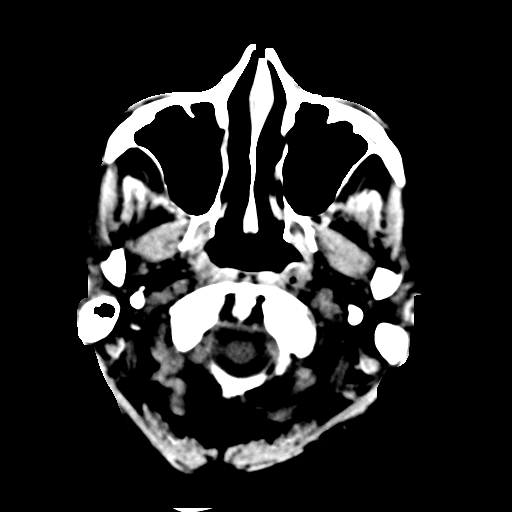
[im 2/31  bone]
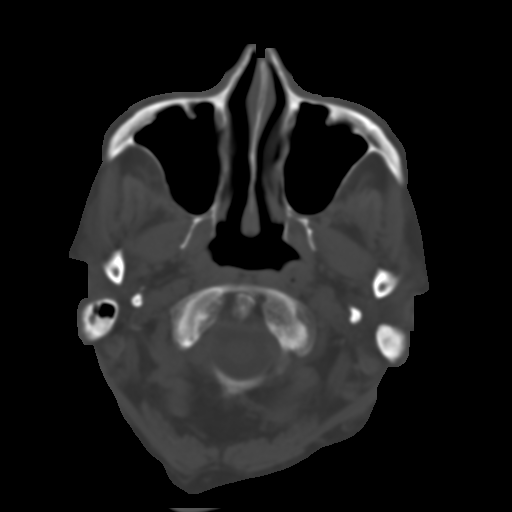
[im 4/31  brain]
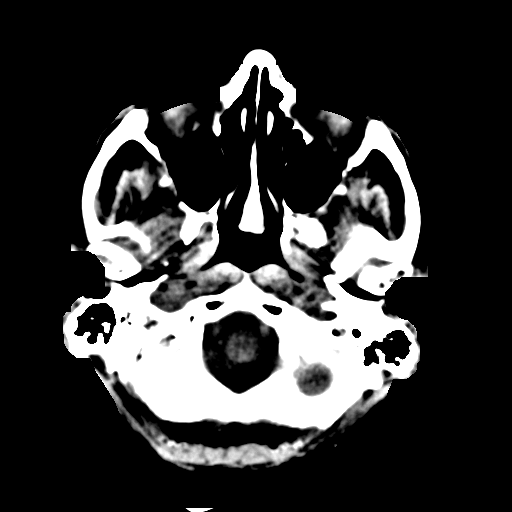
[im 6/31  brain]
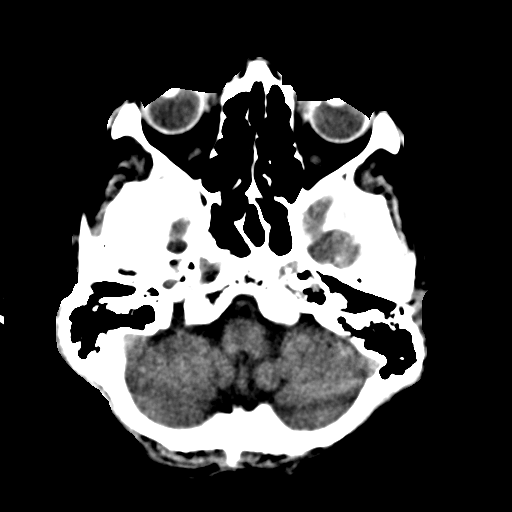
[im 8/31  brain]
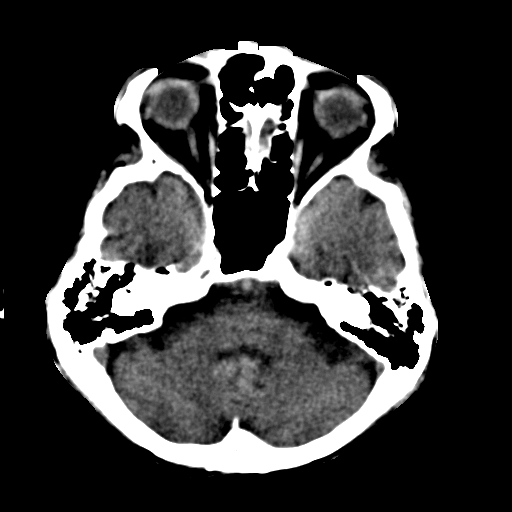
[im 10/31  brain]
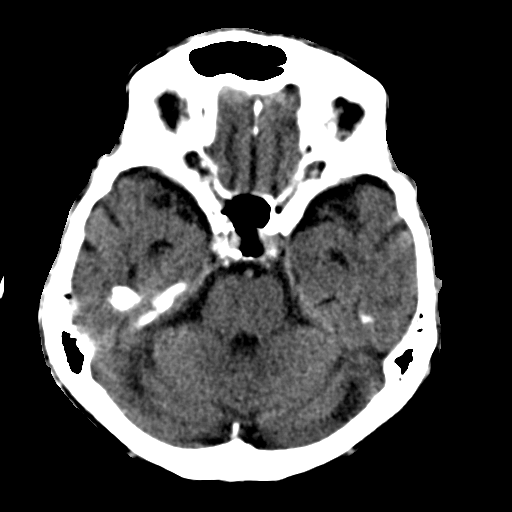
[im 10/31  bone]
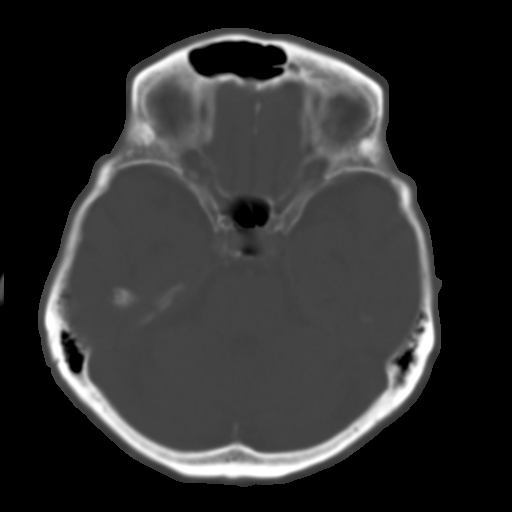
[im 12/31  brain]
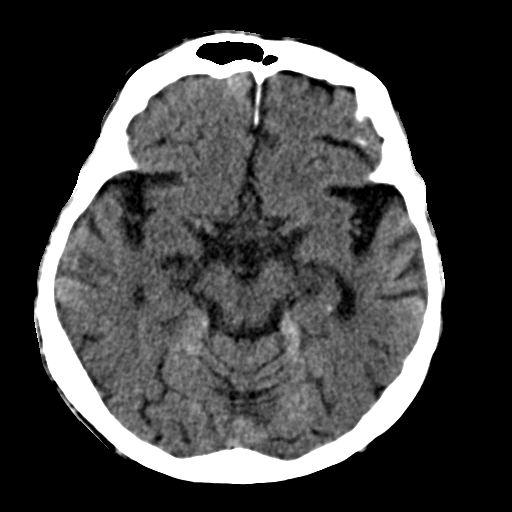
[im 14/31  brain]
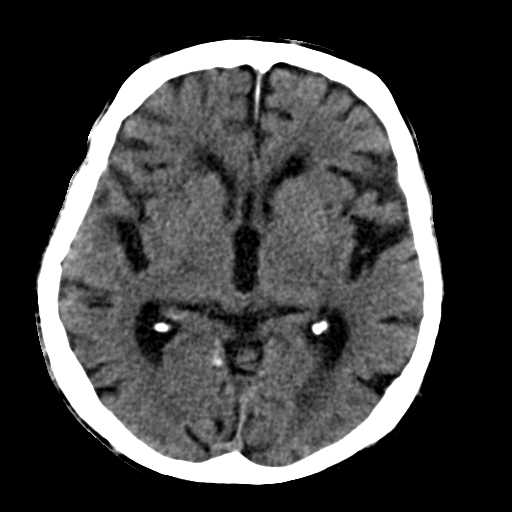
[im 16/31  brain]
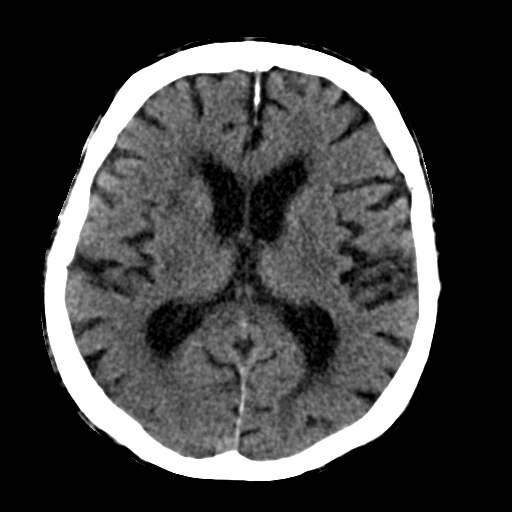
[im 17/31  brain]
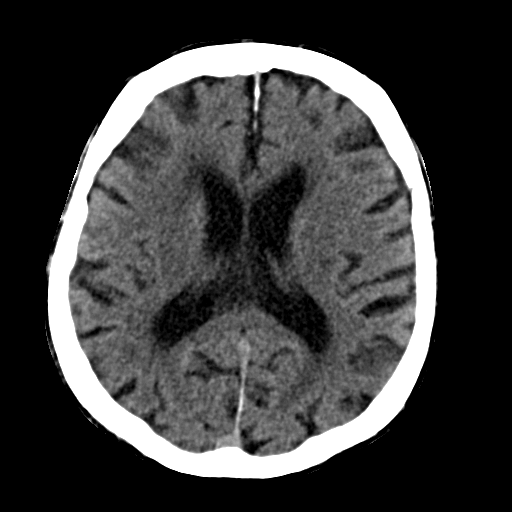
[im 17/31  bone]
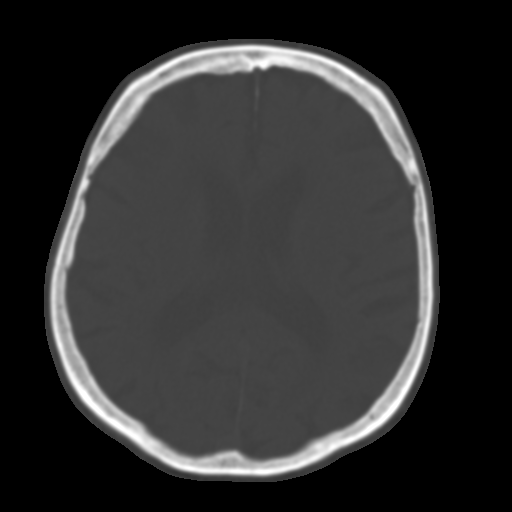
[im 19/31  brain]
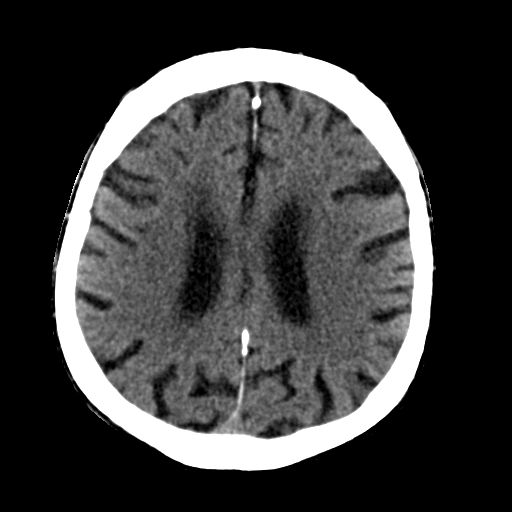
[im 21/31  brain]
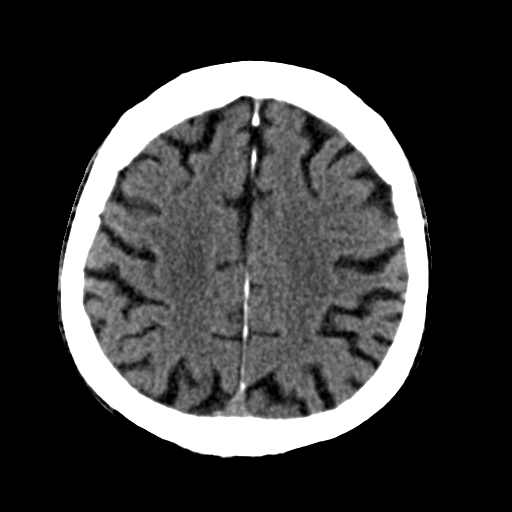
[im 23/31  brain]
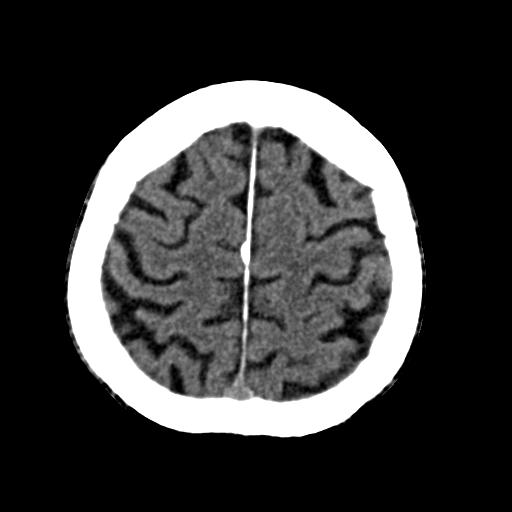
[im 25/31  brain]
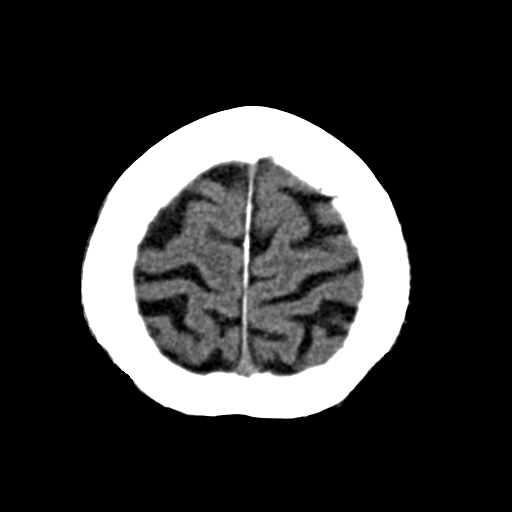
[im 25/31  bone]
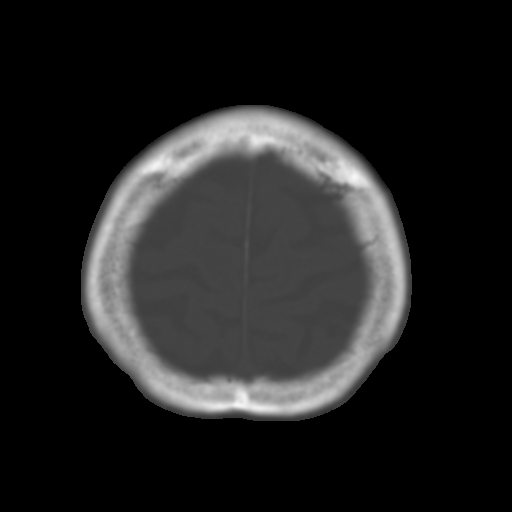
[im 27/31  brain]
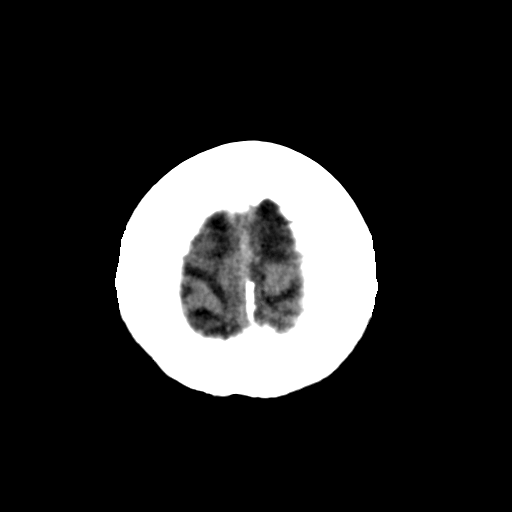
[im 29/31  brain]
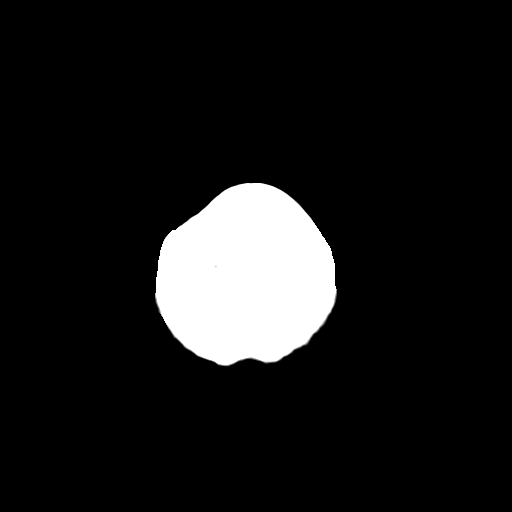

[15 of 30 positions shown; findings below may reference images not displayed]

PROCEDURE:     CT  - CT HEAD WITHOUT CONTRAST  - January 17, 2013  [DATE]

RESULT:     Axial noncontrast CT scanning was performed through the brain
with reconstructions at 5 mm intervals and slice thicknesses. Comparison is
made to the study March 31, 2011.

There is moderate diffuse cerebral and cerebellar atrophy with compensatory
ventriculomegaly. There is decreased density in the deep white matter of
both cerebral hemispheres consistent with chronic small vessel ischemic type
change. There is no evidence of an evolving ischemic infarction nor of an
acute intracranial hemorrhage. The cerebellum and brainstem are normal in
density.

At bone window settings the observed portions of the paranasal sinuses and
mastoid air cells are clear. There is no evidence of an acute skull fracture.
IMPRESSION: 1. There are age related atrophic changes and white matter density changes
which appear stable.
2. There is no evidence of an acute ischemic or hemorrhagic infarction.
3. There is no intracranial mass effect nor hydrocephalus.

[REDACTED]

## 2014-11-08 IMAGING — CR DG CHEST 1V PORT
1 series · 2 of 2 positions shown · non-contrast
Comparison: none

REASON FOR EXAM: Shortness of Breath
COMMENTS:

[Series 1: ap · 0.17mm/px · 2 of 2 slices shown]
[im 1/2]
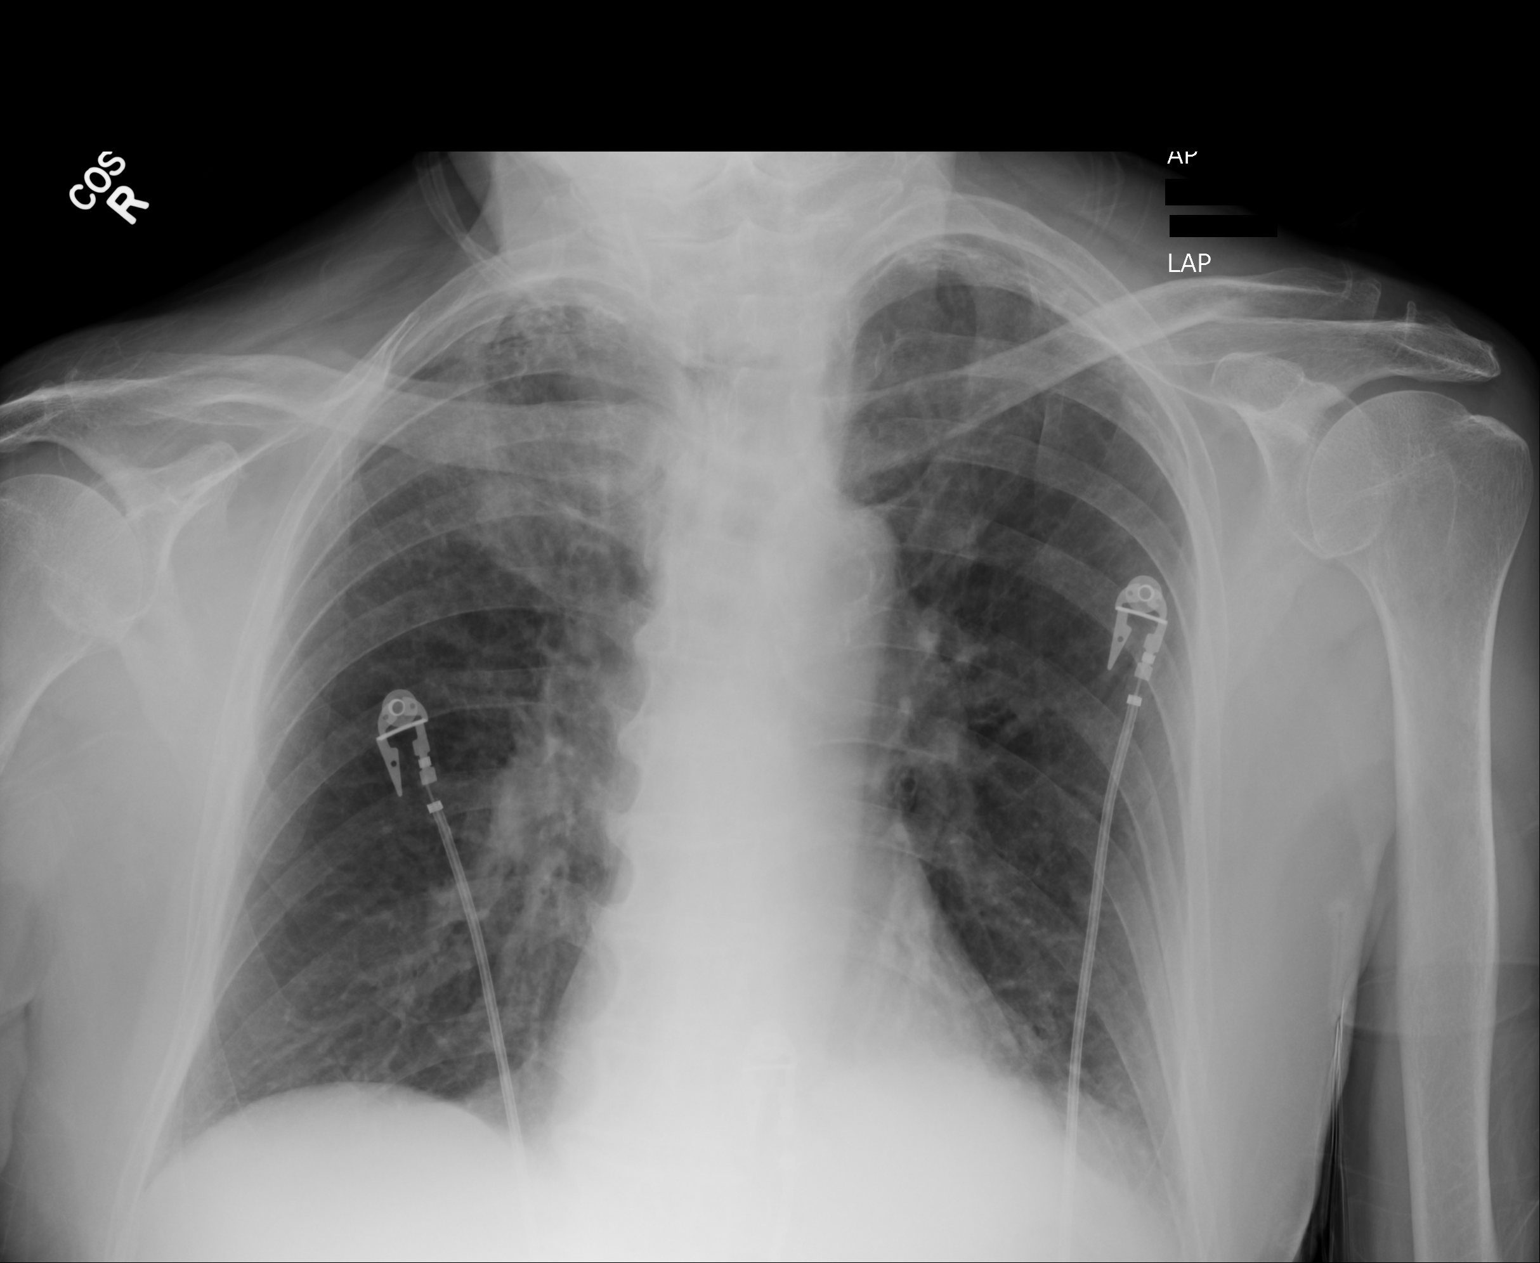
[im 2/2]
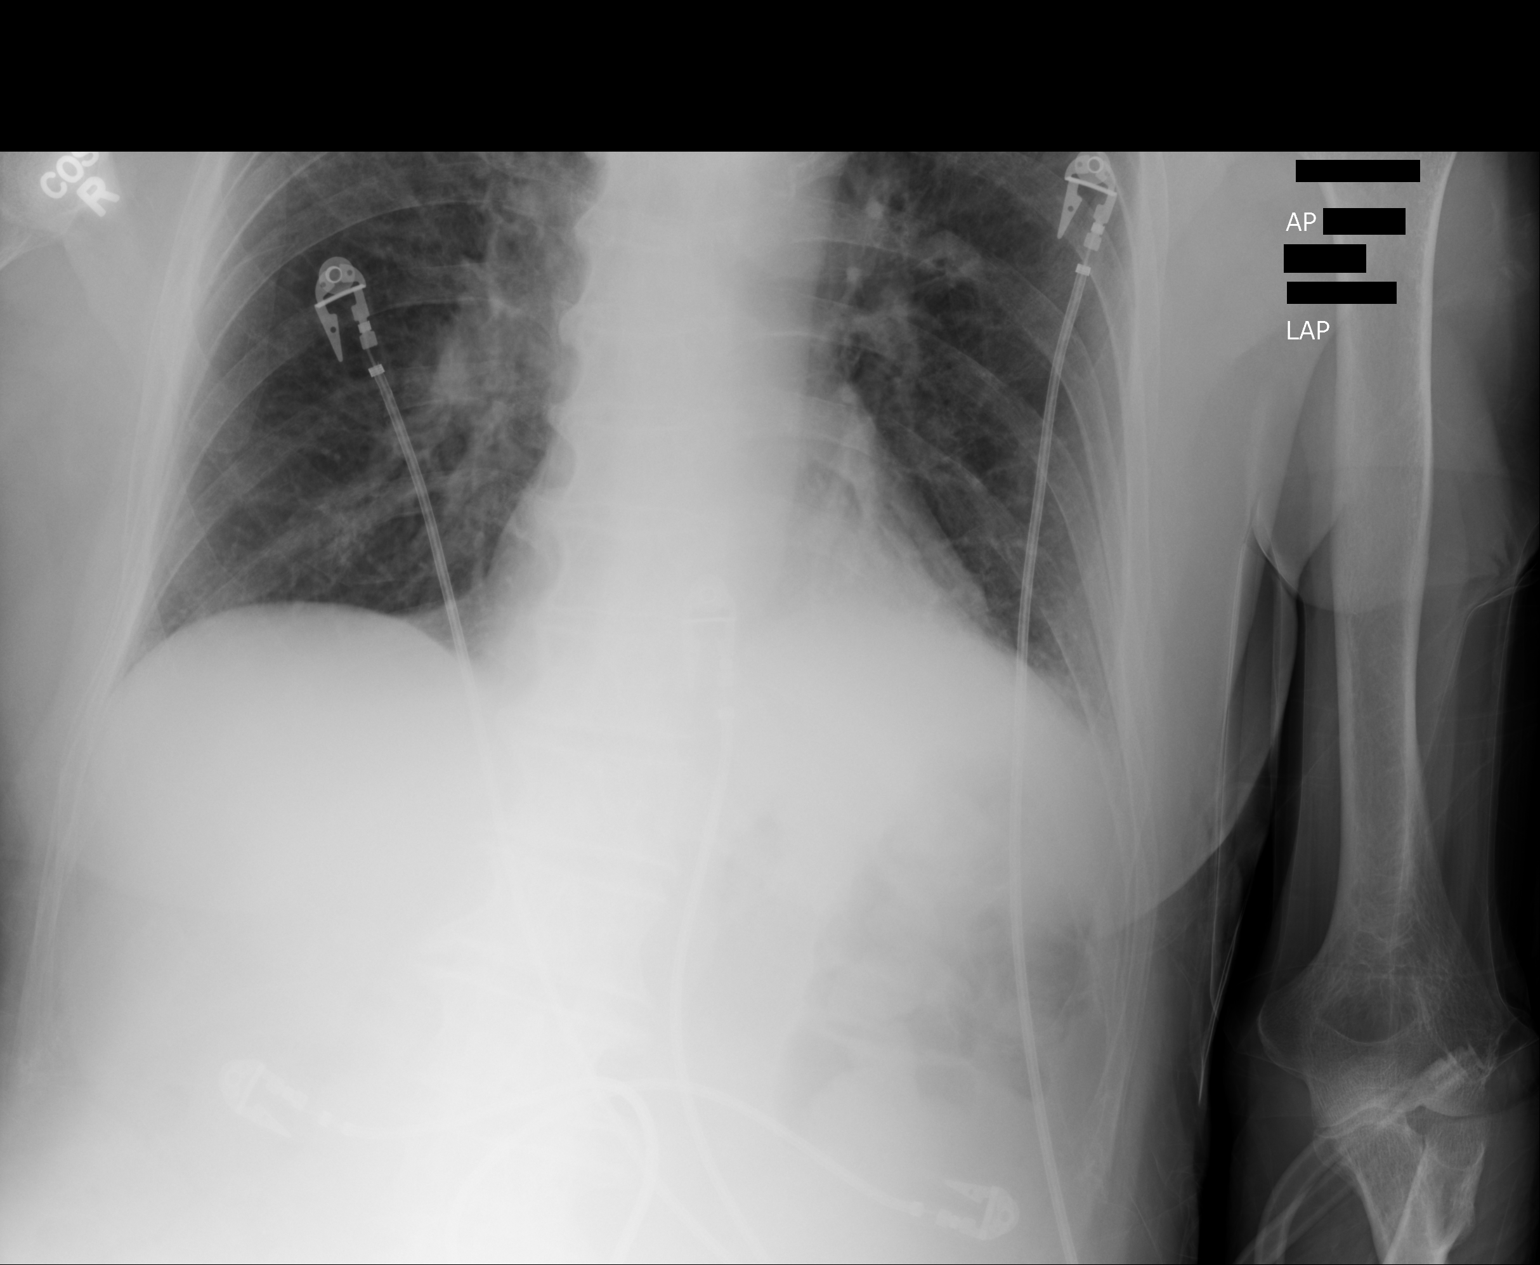

[2 of 2 positions shown; findings below may reference images not displayed]

PROCEDURE:     DXR - DXR PORTABLE CHEST SINGLE VIEW  - January 17, 2013 [DATE]

RESULT:     The lungs are well-expanded. The retrocardiac region on the left
demonstrates new increased density with partial obscuration of the
hemidiaphragm. There is slightly increased density in an interstitial
fashion in the right upper hemithorax. The cardiac silhouette is normal in
size. There is no definite pleural effusion or pneumothorax. The bony thorax
is normal in appearance.
IMPRESSION: The findings suggest atelectasis or developing pneumonia in
the left lower lobe posteriorly and in the right upper lobe. A followup PA
and lateral chest x-ray is recommended.

[REDACTED]

## 2014-11-12 IMAGING — CR DG CHEST 1V PORT
1 series · 1 of 1 positions shown · non-contrast
Comparison: none

REASON FOR EXAM: hypoxia, recent pna. eval for progression
COMMENTS:

[ap]
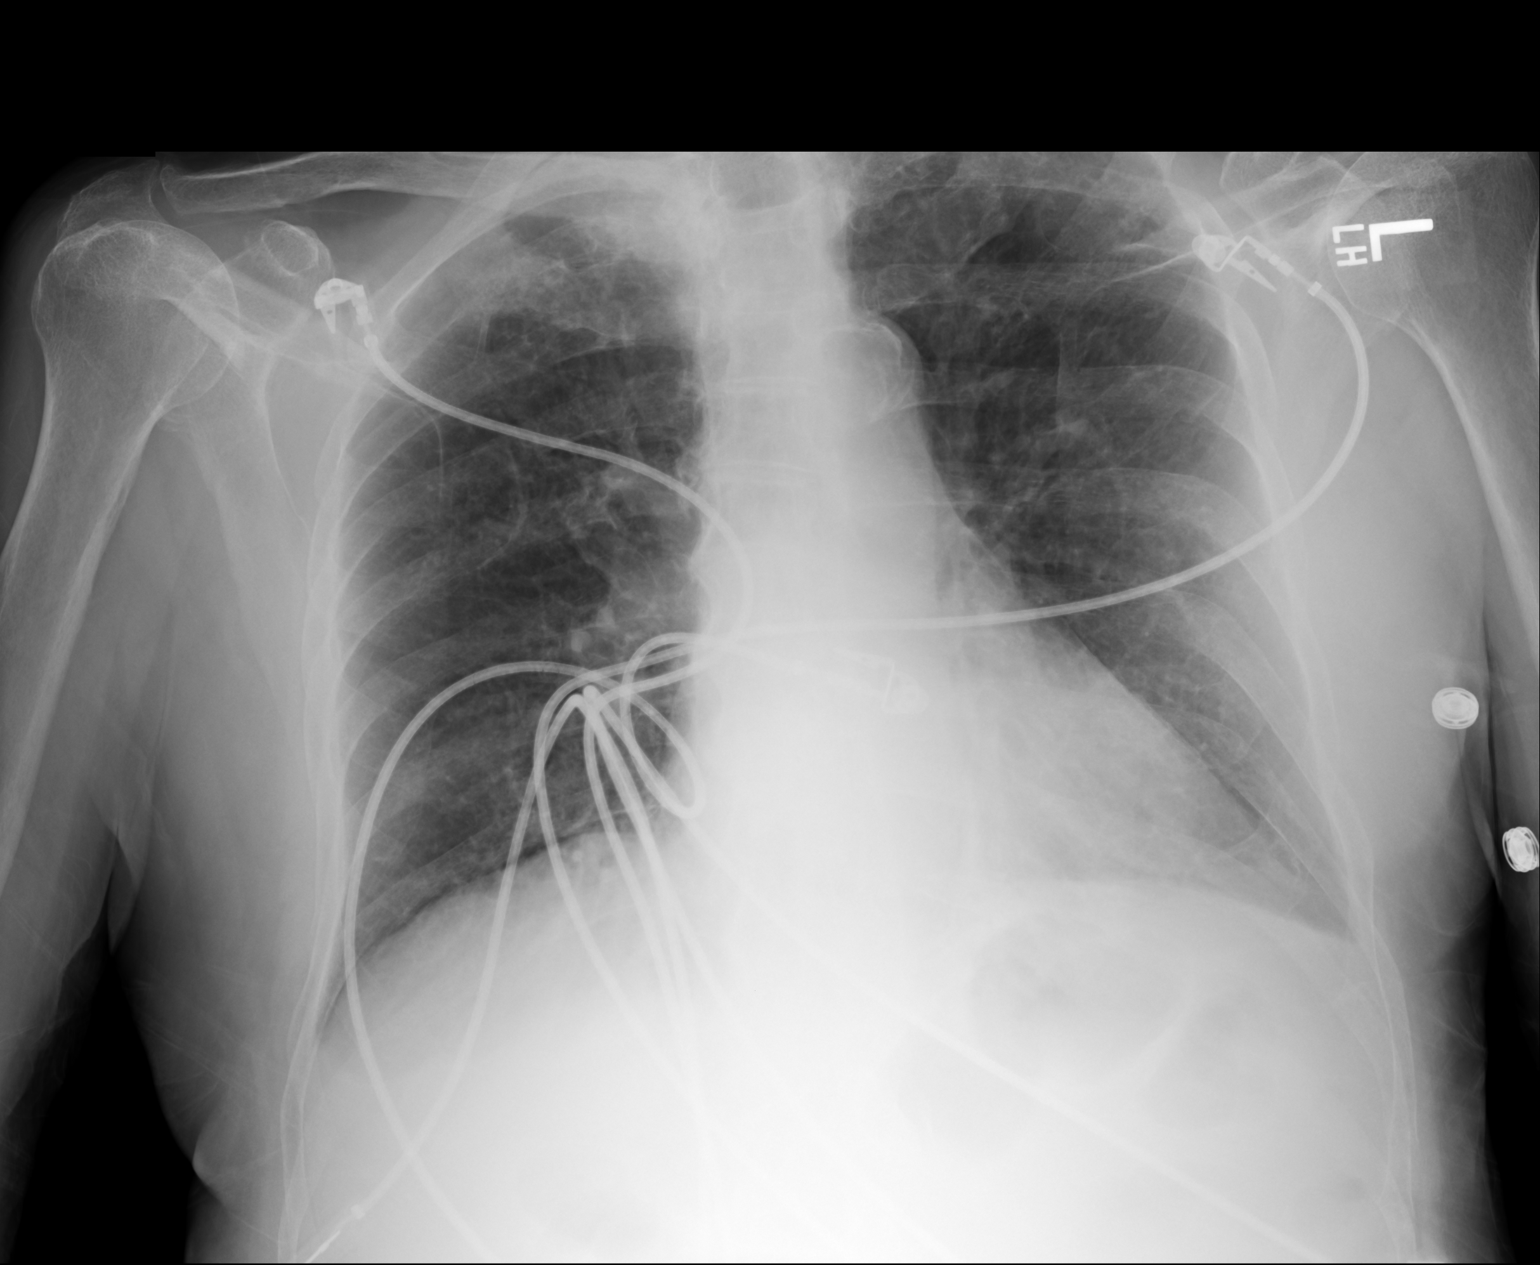

[1 of 1 positions shown; findings below may reference images not displayed]

PROCEDURE:     DXR - DXR PORTABLE CHEST SINGLE VIEW  - January 21, 2013  [DATE]

RESULT:     Comparison is made to the previous examination dated 01/17/2013.

Cardiac monitoring electrodes are present. The lungs appear to be clear
without edema, mass, infiltrate or significant change. There is no effusion
or pneumothorax. Atherosclerotic calcification is present. There are
degenerative changes in the left shoulder greater than the right shoulder.
IMPRESSION: 1. No acute cardiopulmonary disease.

[REDACTED]

## 2014-11-27 IMAGING — CR DG CHEST 1V PORT
1 series · 1 of 1 positions shown · non-contrast
Comparison: Chest radiograph performed 01/21/2013

CLINICAL DATA: Cough, weakness and shortness of breath.

EXAM:
PORTABLE CHEST - 1 VIEW

[ap]
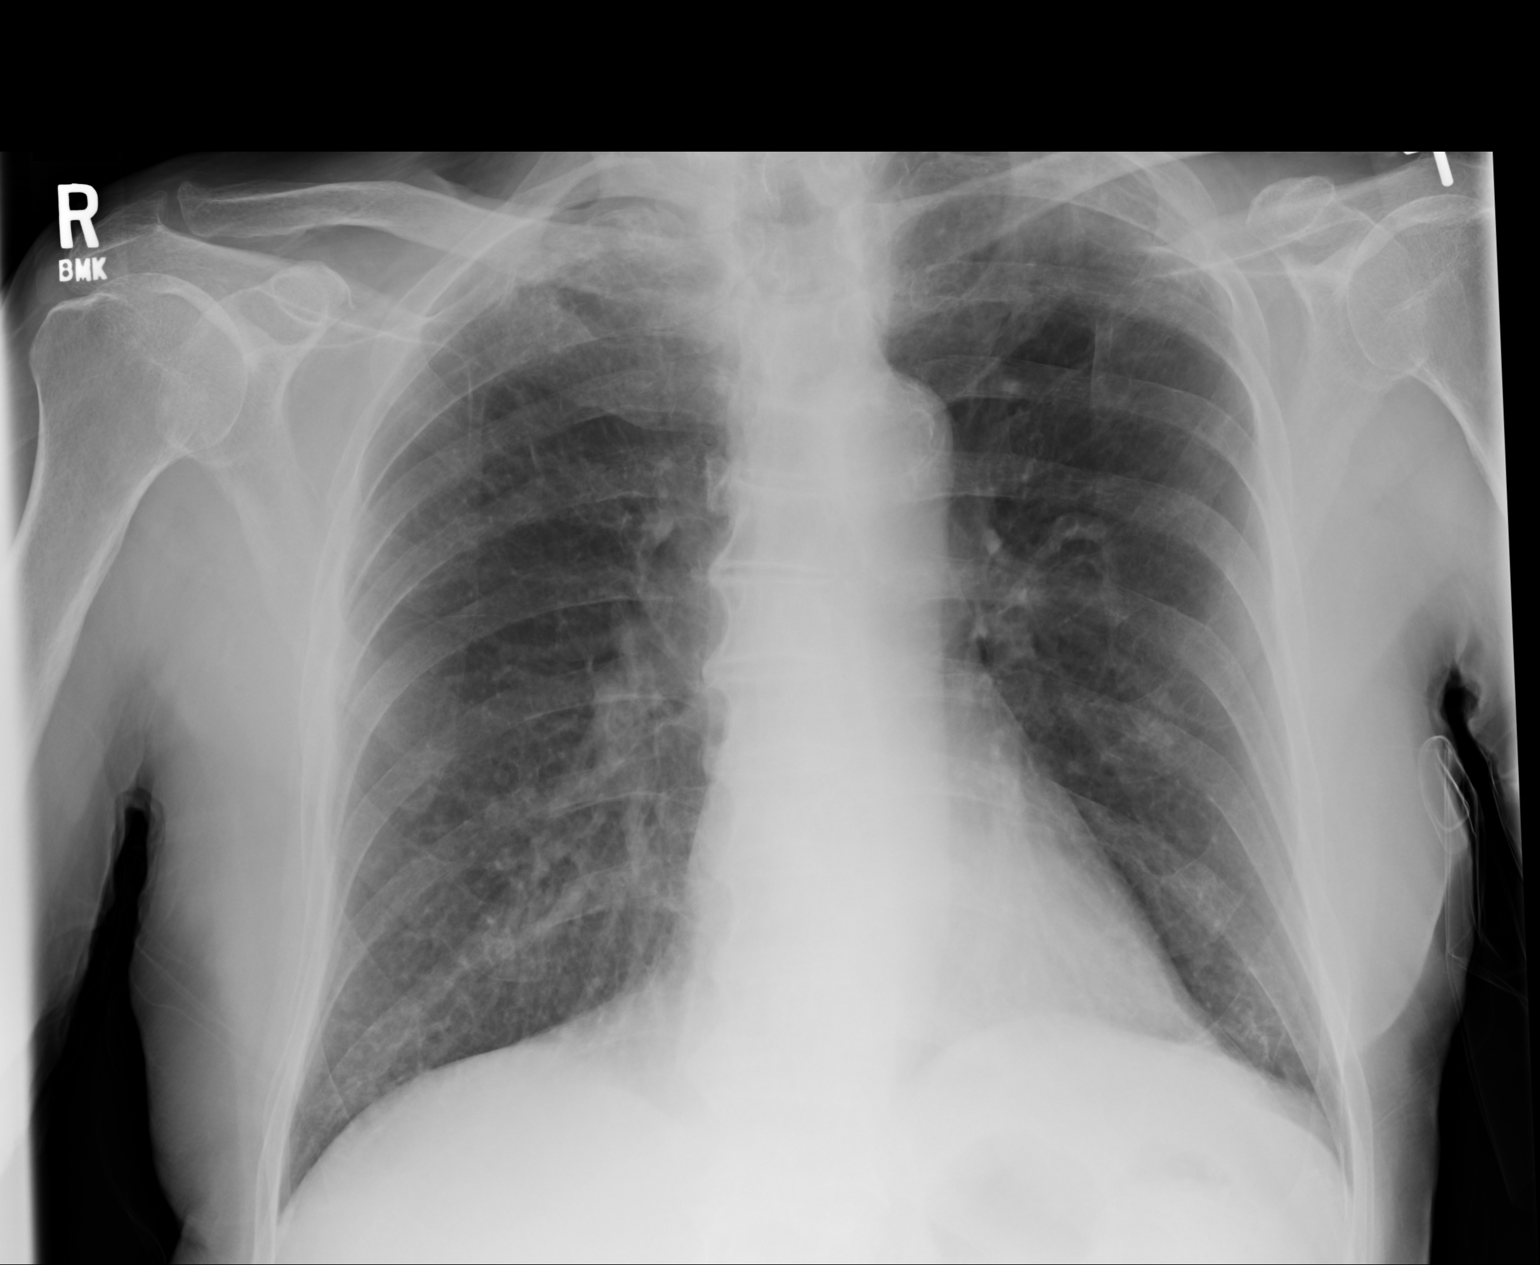

[1 of 1 positions shown; findings below may reference images not displayed]

FINDINGS: The lungs are well-aerated. Pulmonary vascularity is at the upper
limits of normal. There is no evidence of focal opacification,
pleural effusion or pneumothorax.

The cardiomediastinal silhouette is within normal limits. No acute
osseous abnormalities are seen.
IMPRESSION: No acute cardiopulmonary process seen.
